# Patient Record
Sex: Female | Born: 1990 | Hispanic: Yes | Marital: Single | Smoking: Never smoker
Health system: Southern US, Community
[De-identification: ages and names within clinical notes are randomized; demographics above are authoritative.]

## PROBLEM LIST (undated history)

## (undated) DIAGNOSIS — R682 Dry mouth, unspecified: Secondary | ICD-10-CM

## (undated) DIAGNOSIS — F410 Panic disorder [episodic paroxysmal anxiety] without agoraphobia: Secondary | ICD-10-CM

## (undated) DIAGNOSIS — R790 Abnormal level of blood mineral: Secondary | ICD-10-CM

## (undated) HISTORY — DX: Abnormal level of blood mineral: R79.0

## (undated) HISTORY — DX: Dry mouth, unspecified: R68.2

## (undated) HISTORY — DX: Panic disorder (episodic paroxysmal anxiety): F41.0

---

## 2014-05-31 ENCOUNTER — Other Ambulatory Visit: Payer: Self-pay | Admitting: Adult Health

## 2014-05-31 ENCOUNTER — Ambulatory Visit (INDEPENDENT_AMBULATORY_CARE_PROVIDER_SITE_OTHER): Payer: Self-pay

## 2014-05-31 DIAGNOSIS — W19XXXA Unspecified fall, initial encounter: Secondary | ICD-10-CM

## 2014-05-31 DIAGNOSIS — M25552 Pain in left hip: Secondary | ICD-10-CM

## 2014-05-31 DIAGNOSIS — M542 Cervicalgia: Secondary | ICD-10-CM

## 2015-11-21 IMAGING — CR DG CERVICAL SPINE 2 OR 3 VIEWS
3 series · 3 of 3 positions shown · non-contrast
Comparison: None.

CLINICAL DATA: Fell hitting head, neck pain

EXAM:
CERVICAL SPINE - 2-3 VIEW

[view not recorded (1 of 3)]
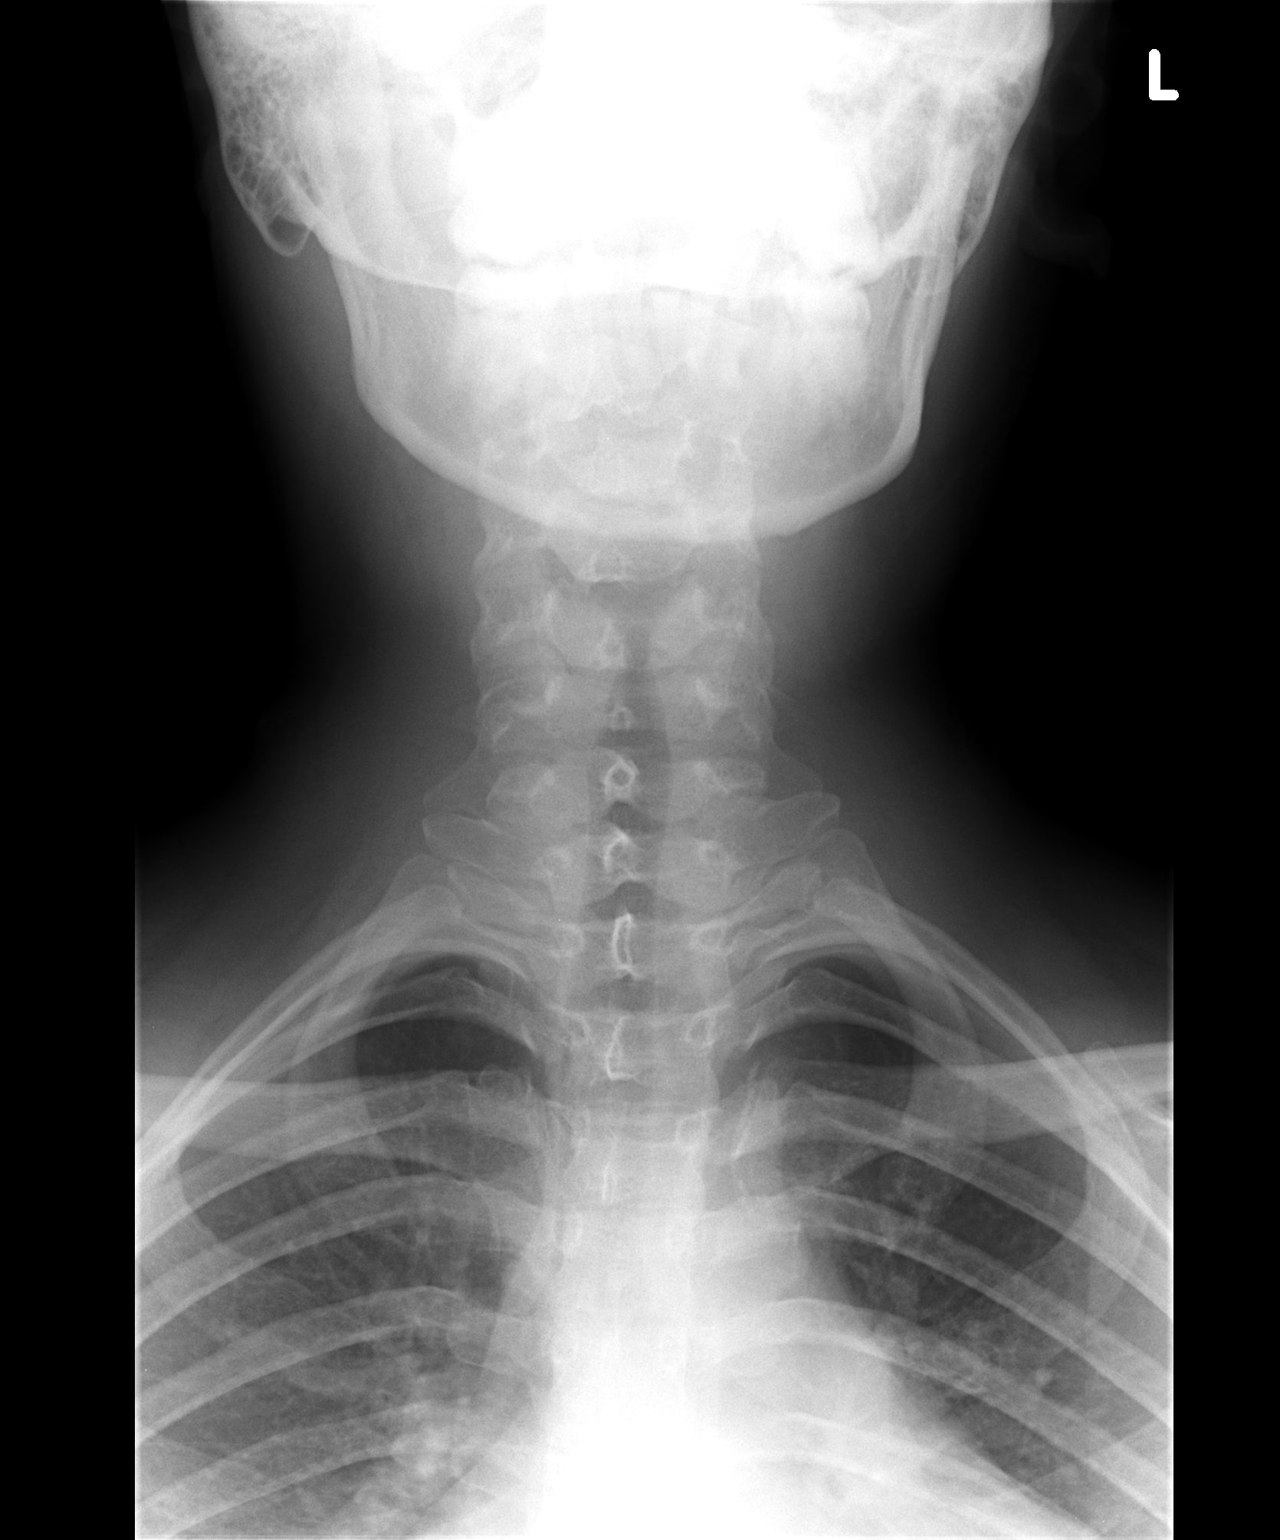

[view not recorded (2 of 3)]
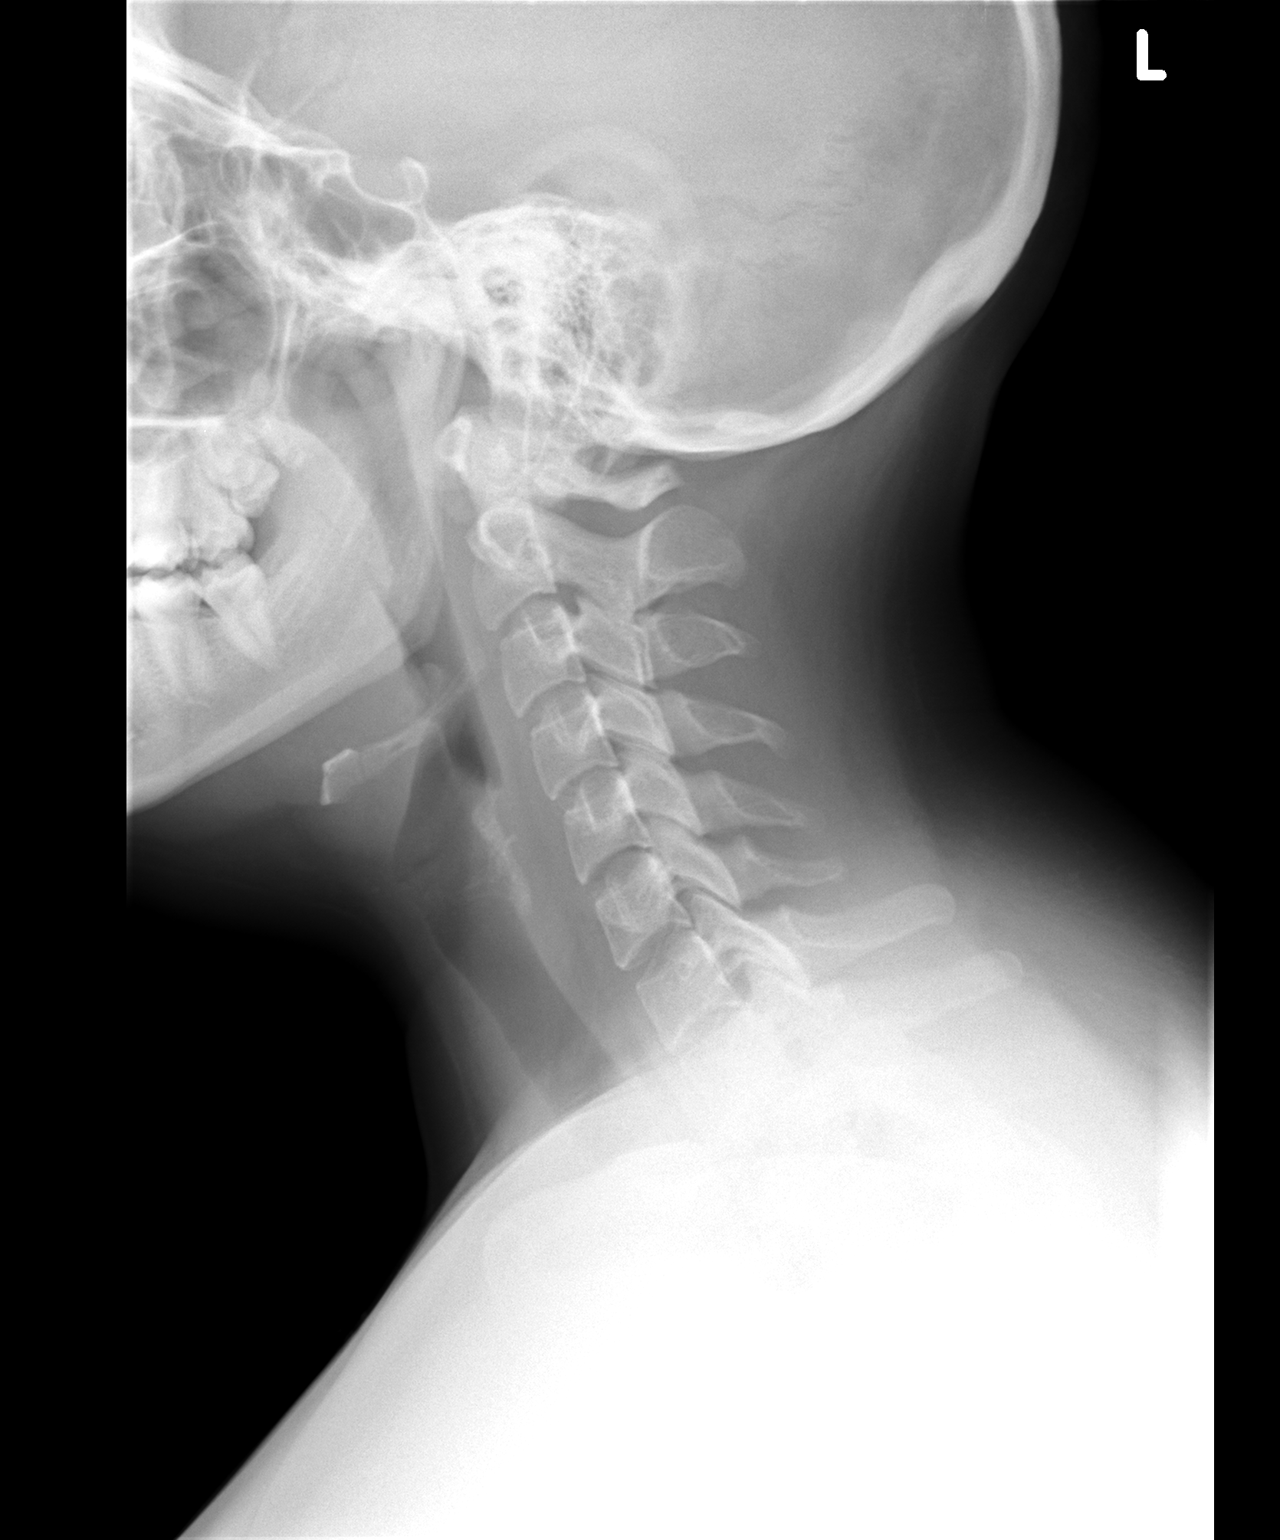

[view not recorded (3 of 3)]
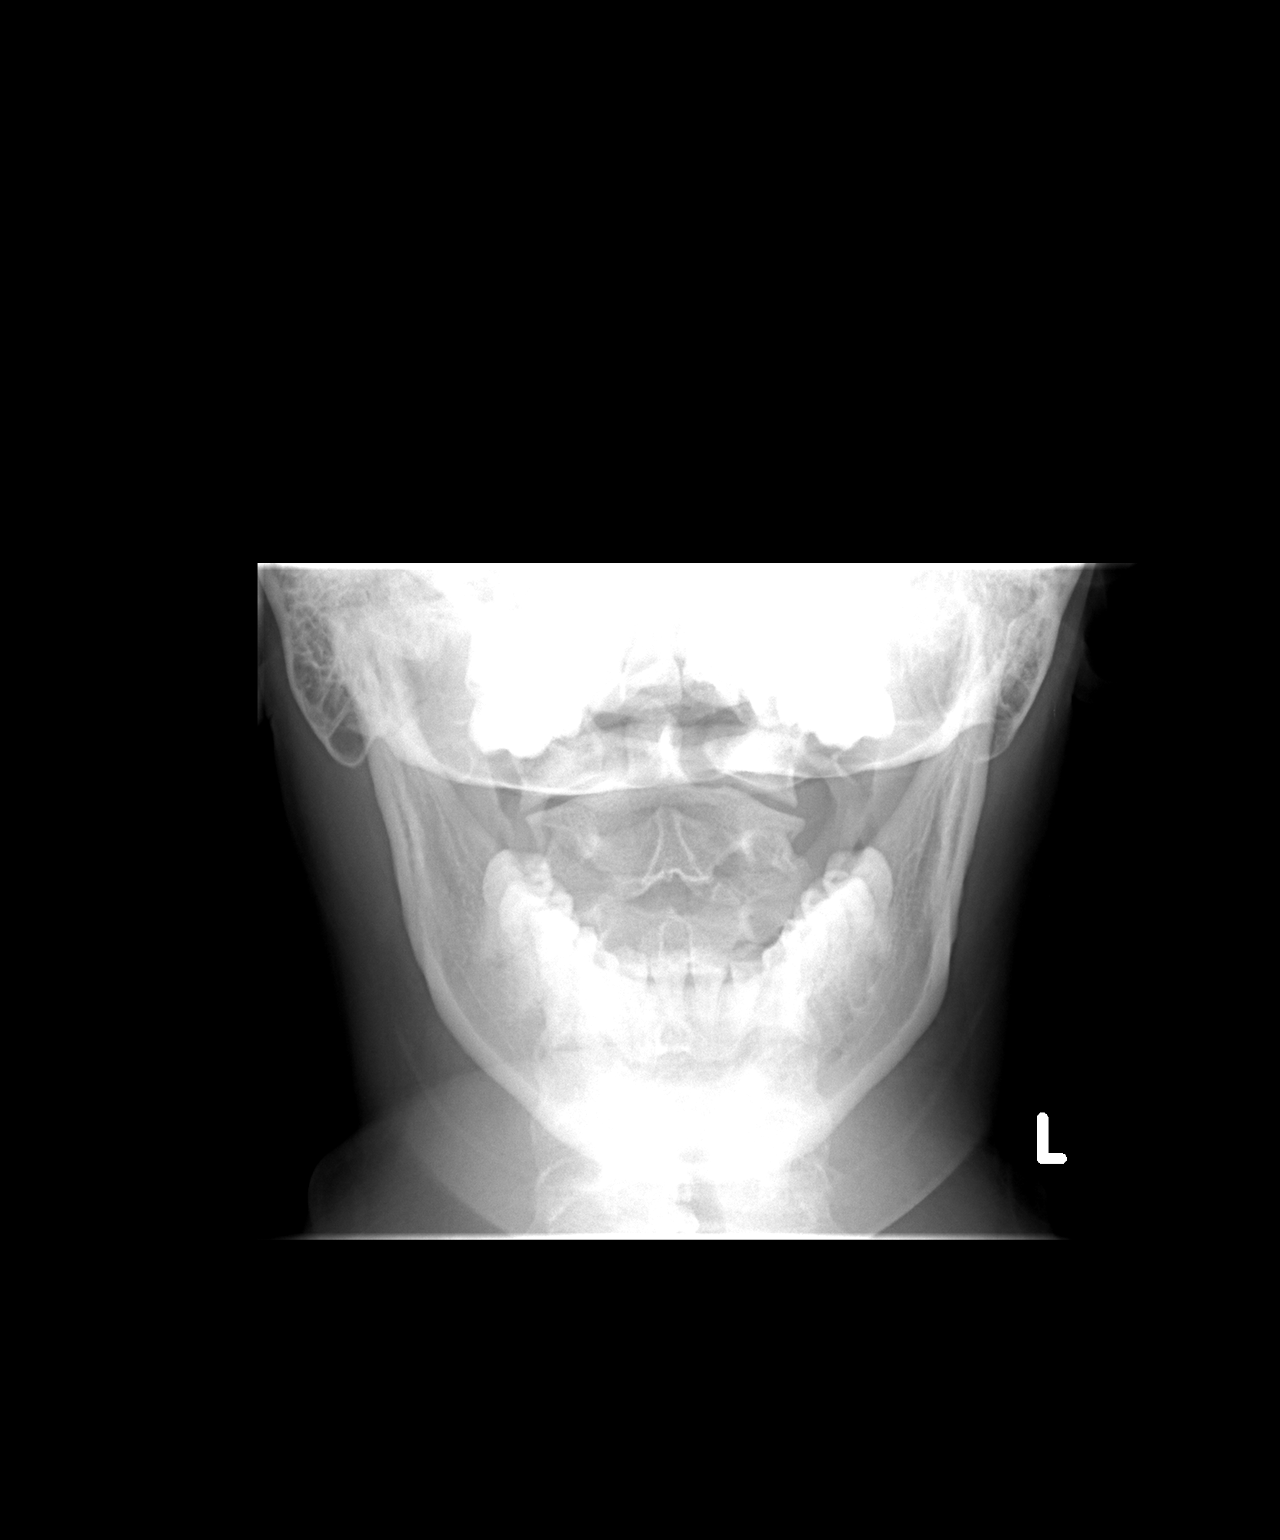

[3 of 3 positions shown; findings below may reference images not displayed]

FINDINGS: The cervical vertebrae are in normal alignment. Intervertebral disc
spaces appear normal. No prevertebral soft tissue swelling is
seen.The odontoid process is intact. The lung apices are clear.
IMPRESSION: Normal alignment.  Normal intervertebral disc spaces.

## 2016-10-12 ENCOUNTER — Encounter: Payer: Self-pay | Admitting: Physician Assistant

## 2016-10-12 ENCOUNTER — Ambulatory Visit (INDEPENDENT_AMBULATORY_CARE_PROVIDER_SITE_OTHER): Payer: Managed Care, Other (non HMO) | Admitting: Physician Assistant

## 2016-10-12 VITALS — BP 115/77 | HR 73 | Ht 60.0 in | Wt 164.0 lb

## 2016-10-12 DIAGNOSIS — N898 Other specified noninflammatory disorders of vagina: Secondary | ICD-10-CM | POA: Diagnosis not present

## 2016-10-12 DIAGNOSIS — F410 Panic disorder [episodic paroxysmal anxiety] without agoraphobia: Secondary | ICD-10-CM

## 2016-10-12 DIAGNOSIS — Z0000 Encounter for general adult medical examination without abnormal findings: Secondary | ICD-10-CM

## 2016-10-12 DIAGNOSIS — Z131 Encounter for screening for diabetes mellitus: Secondary | ICD-10-CM | POA: Diagnosis not present

## 2016-10-12 DIAGNOSIS — Z13220 Encounter for screening for lipoid disorders: Secondary | ICD-10-CM | POA: Diagnosis not present

## 2016-10-12 HISTORY — DX: Panic disorder (episodic paroxysmal anxiety): F41.0

## 2016-10-12 LAB — COMPLETE METABOLIC PANEL WITH GFR
ALBUMIN: 4.2 g/dL (ref 3.6–5.1)
ALK PHOS: 68 U/L (ref 33–115)
ALT: 13 U/L (ref 6–29)
AST: 16 U/L (ref 10–30)
BILIRUBIN TOTAL: 0.5 mg/dL (ref 0.2–1.2)
BUN: 7 mg/dL (ref 7–25)
CHLORIDE: 107 mmol/L (ref 98–110)
CO2: 23 mmol/L (ref 20–31)
CREATININE: 0.53 mg/dL (ref 0.50–1.10)
Calcium: 9 mg/dL (ref 8.6–10.2)
GFR, Est Non African American: >89 mL/min (ref >=60)
Glucose, Bld: 90 mg/dL (ref 65–99)
Potassium: 4.1 mmol/L (ref 3.5–5.3)
Sodium: 139 mmol/L (ref 135–146)
Total Protein: 6.7 g/dL (ref 6.1–8.1)

## 2016-10-12 LAB — CBC WITH DIFFERENTIAL/PLATELET
Basophils Absolute: 60 cells/uL (ref 0–200)
Basophils Relative: 1 %
Eosinophils Absolute: 120 cells/uL (ref 15–500)
Eosinophils Relative: 2 %
HCT: 38.9 % (ref 35.0–45.0)
Hemoglobin: 12.6 g/dL (ref 11.7–15.5)
LYMPHS PCT: 34 %
Lymphs Abs: 2040 cells/uL (ref 850–3900)
MCH: 28.2 pg (ref 27.0–33.0)
MCHC: 32.4 g/dL (ref 32.0–36.0)
MCV: 87 fL (ref 80.0–100.0)
MONO ABS: 360 {cells}/uL (ref 200–950)
MONOS PCT: 6 %
MPV: 9.1 fL (ref 7.5–12.5)
Neutro Abs: 3420 cells/uL (ref 1500–7800)
Neutrophils Relative %: 57 %
Platelets: 342 10*3/uL (ref 140–400)
RBC: 4.47 MIL/uL (ref 3.80–5.10)
RDW: 13.9 % (ref 11.0–15.0)
WBC: 6 10*3/uL (ref 3.8–10.8)

## 2016-10-12 LAB — WET PREP FOR TRICH, YEAST, CLUE
Trich, Wet Prep: NONE SEEN
Yeast Wet Prep HPF POC: NONE SEEN

## 2016-10-12 LAB — LIPID PANEL
Cholesterol: 147 mg/dL (ref <200)
HDL: 40 mg/dL — AB (ref >50)
LDL CALC: 89 mg/dL (ref <100)
Total CHOL/HDL Ratio: 3.7 Ratio (ref <5.0)
Triglycerides: 92 mg/dL (ref <150)
VLDL: 18 mg/dL (ref <30)

## 2016-10-12 LAB — TSH: TSH: 0.98 mIU/L

## 2016-10-12 LAB — FERRITIN: FERRITIN: 26 ng/mL (ref 10–154)

## 2016-10-12 MED ORDER — CLONAZEPAM 0.5 MG PO TABS: ORAL_TABLET | ORAL | 0 refills | Status: DC

## 2016-10-12 MED ORDER — METRONIDAZOLE 500 MG PO TABS: 500.0000 mg | ORAL_TABLET | Freq: Two times a day (BID) | ORAL | 0 refills | Status: DC

## 2016-10-12 NOTE — Addendum Note (Signed)
Addended by: Jomarie LongsBREEBACK, Ilyse Tremain L on: 10/12/2016 12:15 PM   Modules accepted: Orders

## 2016-10-12 NOTE — Progress Notes (Addendum)
Subjective:     Patient ID: Evelyn KollerYesenia Ocampo-Jimenez, female   DOB: [birth year], 26 y.o.   MRN: [id number]  HPI   Patient presents today to establish care and for annual work physical.   Also reports that a couple of months ago she was diagnosed with bacterial vaginosis but never received any medications. She states she has had discharge on and off since the diagnosis and occasionally notices an unusual odor so she would like to be rechecked.  Also complains of episodes where she feels like she cannot breathe. States she went to the emergency department 2 years ago for passing out and after she couldn't breathe. At this time she was given a holter monitor for one month which revealed no abnormal heart rhythms. Does state while on the holter monitor she did not experience an attack, however. Reports these episodes generally occur with increased stress and happen once every 2 months. Denies shortness of breath or chest pain exacerbated by walking. No chest pain today.  Social History   Social History  . Marital status: Single    Spouse name: N/A  . Number of children: N/A  . Years of education: N/A   Occupational History  . Not on file.   Social History Main Topics  . Smoking status: Never Smoker  . Smokeless tobacco: Never Used  . Alcohol use No  . Drug use: No  . Sexual activity: Not Currently   Other Topics Concern  . Not on file   Social History Narrative  . No narrative on file    Review of Systems All other ROS negative except those noted in the HPI.     Objective:   Physical Exam  Constitutional: She is oriented to person, place, and time. She appears well-developed and well-nourished.  HENT:  Head: Normocephalic and atraumatic.  Right Ear: External ear normal.  Left Ear: External ear normal.  Mouth/Throat: No oropharyngeal exudate.  Eyes: Conjunctivae and EOM are normal. Pupils are equal, round, and reactive to light.  Neck: Normal range of motion. Neck supple. No  thyromegaly present.  Cardiovascular: Normal rate and regular rhythm.  Exam reveals no gallop and no friction rub.   No murmur heard. Pulmonary/Chest: Effort normal and breath sounds normal. She has no wheezes. She has no rales.  Abdominal: Soft. Bowel sounds are normal. She exhibits no distension. There is no tenderness. There is no rebound and no guarding.  Musculoskeletal: Normal range of motion. She exhibits no edema or tenderness.  Lymphadenopathy:    She has no cervical adenopathy.  Neurological: She is alert and oriented to person, place, and time.  Skin: Skin is warm and dry.  Psychiatric: She has a normal mood and affect. Her behavior is normal.       Assessment/Plan:  Jenne CampusYesenia was seen today for establish care and annual exam.  Diagnoses and all orders for this visit:  Vaginal discharge -     WET PREP FOR TRICH, YEAST, CLUE  Routine physical examination -     TSH -     COMPLETE METABOLIC PANEL WITH GFR -     Lipid panel -     CBC with Differential/Platelet -     Ferritin  Screening for lipid disorders -     Lipid panel  Screening for diabetes mellitus -     COMPLETE METABOLIC PANEL WITH GFR  Panic disorder -     clonazePAM (KLONOPIN) 0.5 MG tablet; Take one tablet as needed for panic attacks  - Screening  labs ordered today. Will call patient with results.  - Rechecked wet prep to ensure resolution of bacterial vaginosis per patient request. - Difficulty breathing, chest tightness, and diaphoresis consistent with panic disorder. Blood pressure well controlled and last lipid panel normal. Previously has had cardiac work up for this problem further supporting the likelihood that this is related to a panic disorder. Instructed patient to take Klonopin only as needed for panic symptoms. States if this works and symptoms resolve with medication, panic disorder is confirmed. If symptoms are unaffected by Klonopin, however, instructed to come back in and will initiate further  work up. Come back in 2 months for recheck of symptom improvement.discussed side effects and abuse potential of klonapin.

## 2016-10-13 ENCOUNTER — Encounter: Payer: Self-pay | Admitting: Physician Assistant

## 2016-10-13 DIAGNOSIS — R790 Abnormal level of blood mineral: Secondary | ICD-10-CM | POA: Insufficient documentation

## 2016-10-13 HISTORY — DX: Abnormal level of blood mineral: R79.0

## 2016-10-13 NOTE — Progress Notes (Signed)
Call pt: iron stores on the low side. Consider ferrous sulfate 325mg  once a day. Not anemic. Thyroid normal range.  Cholesterol great.

## 2016-11-10 ENCOUNTER — Telehealth: Payer: Self-pay

## 2016-11-10 NOTE — Telephone Encounter (Signed)
Yes. We need to do some repeat testing.

## 2016-11-10 NOTE — Telephone Encounter (Signed)
Pt stated that she took the medication as prescribed in March for BV.  She feels that it is back and worse.  She is complaining of painful urination also.  Does she need an office visit?  Please advise.

## 2016-11-11 NOTE — Telephone Encounter (Signed)
Left message on VM to make appointment for further testing.

## 2016-11-25 ENCOUNTER — Telehealth: Payer: Self-pay

## 2016-11-25 NOTE — Telephone Encounter (Signed)
Pt is asking if her biometric screening was faxed.  Please advise.

## 2016-11-26 NOTE — Telephone Encounter (Signed)
Please let her know that was faxed today. Please send to scan for EMR.

## 2016-11-26 NOTE — Telephone Encounter (Signed)
Faxed today

## 2016-12-11 ENCOUNTER — Encounter: Payer: Self-pay | Admitting: Physician Assistant

## 2016-12-11 ENCOUNTER — Ambulatory Visit (INDEPENDENT_AMBULATORY_CARE_PROVIDER_SITE_OTHER): Payer: Managed Care, Other (non HMO) | Admitting: Physician Assistant

## 2016-12-11 ENCOUNTER — Ambulatory Visit: Payer: Managed Care, Other (non HMO) | Admitting: Physician Assistant

## 2016-12-11 VITALS — BP 120/75 | HR 81 | Ht 60.0 in | Wt 164.0 lb

## 2016-12-11 DIAGNOSIS — N760 Acute vaginitis: Secondary | ICD-10-CM

## 2016-12-11 LAB — WET PREP FOR TRICH, YEAST, CLUE
Clue Cells Wet Prep HPF POC: NONE SEEN
Trich, Wet Prep: NONE SEEN
YEAST WET PREP: NONE SEEN

## 2016-12-11 MED ORDER — HYDROCORTISONE VALERATE 0.2 % EX OINT: 1.0000 "application " | TOPICAL_OINTMENT | Freq: Two times a day (BID) | CUTANEOUS | 0 refills | Status: DC

## 2016-12-11 NOTE — Progress Notes (Signed)
   Subjective:    Patient ID: Evelyn Mullen, female    DOB: 01-31-1991, 26 y.o.   MRN: [id number]  HPI  Pt is a 26 yo female who presents to the clinic with external irritation, burning and at times itching for last 2-3 weeks. She was seen not long ago and dx of bacterial vaginosis and treated with metronidazole. Discharge resolved but labia itching/burning continued. No abdominal pain, flank pain, dyspareunia. She has not been sexually active in 3 months. She admits using some new toilet paper and causing some irritation. She stopped using toilet paper. She is unaware of anything that comes into contact with her genitals that is unsual.     Review of Systems  All other systems reviewed and are negative.      Objective:   Physical Exam  Constitutional: She is oriented to person, place, and time. She appears well-developed and well-nourished.  HENT:  Head: Normocephalic and atraumatic.  Cardiovascular: Normal rate, regular rhythm and normal heart sounds.   Pulmonary/Chest: Effort normal and breath sounds normal.  Abdominal: Soft. Bowel sounds are normal. She exhibits no distension and no mass. There is no tenderness. There is no rebound and no guarding.  Genitourinary: No vaginal discharge found.  Genitourinary Comments: Scant whitish film over external labia on an erythematous base.   Neurological: She is alert and oriented to person, place, and time.  Psychiatric: She has a normal mood and affect. Her behavior is normal.          Assessment & Plan:  Marland Kitchen.Marland Kitchen.Jenne CampusYesenia was seen today for vaginal irritation.  Diagnoses and all orders for this visit:  Acute vaginitis -     GC/Chlamydia Probe Amp -     WET PREP FOR TRICH, YEAST, CLUE  Other orders -     hydrocortisone valerate ointment (WESTCORT) 0.2 %; Apply 1 application topically 2 (two) times daily.   .. Results for orders placed or performed in visit on 12/11/16  WET PREP FOR TRICH, YEAST, CLUE  Result Value Ref Range    Yeast Wet Prep HPF POC NONE SEEN NONE SEEN   Trich, Wet Prep NONE SEEN NONE SEEN   Clue Cells Wet Prep HPF POC NONE SEEN NONE SEEN   WBC, Wet Prep HPF POC MANY (A) NONE SEEN  no yeast. Ok to send in topical steroid cream. Appears to be contact dermatitis. Watch out for trigger.  GC/CT testing done today.  Follow up as needed.

## 2016-12-11 NOTE — Progress Notes (Signed)
Call pt: no yeast, trich, clue cells. I am sending low dose steroid cream for external irritation. Watch things that are coming in contact with genitals I feel like you are and an allergic response to something you are coming in contact with.

## 2016-12-11 NOTE — Patient Instructions (Signed)
Will call with stat wet prep.

## 2016-12-12 LAB — GC/CHLAMYDIA PROBE AMP
CT Probe RNA: DETECTED — AB
GC Probe RNA: NOT DETECTED

## 2016-12-14 MED ORDER — AZITHROMYCIN 1 G PO PACK: 1.0000 g | PACK | Freq: Once | ORAL | 0 refills | Status: AC

## 2016-12-14 NOTE — Addendum Note (Signed)
Addended by: Jomarie LongsBREEBACK, Shakenna Herrero L on: 12/14/2016 08:09 AM   Modules accepted: Orders

## 2016-12-22 ENCOUNTER — Ambulatory Visit (INDEPENDENT_AMBULATORY_CARE_PROVIDER_SITE_OTHER): Payer: Managed Care, Other (non HMO) | Admitting: Physician Assistant

## 2016-12-22 ENCOUNTER — Encounter: Payer: Self-pay | Admitting: Physician Assistant

## 2016-12-22 ENCOUNTER — Other Ambulatory Visit (HOSPITAL_COMMUNITY)
Admission: RE | Admit: 2016-12-22 | Discharge: 2016-12-22 | Disposition: A | Discharge status: Home / Self Care | Payer: Managed Care, Other (non HMO) | Source: Ambulatory Visit | Attending: Physician Assistant | Admitting: Physician Assistant

## 2016-12-22 VITALS — BP 128/85 | HR 93 | Ht 60.0 in | Wt 162.0 lb

## 2016-12-22 DIAGNOSIS — Z8619 Personal history of other infectious and parasitic diseases: Secondary | ICD-10-CM | POA: Diagnosis present

## 2016-12-22 DIAGNOSIS — Z113 Encounter for screening for infections with a predominantly sexual mode of transmission: Secondary | ICD-10-CM | POA: Diagnosis not present

## 2016-12-22 DIAGNOSIS — Z23 Encounter for immunization: Secondary | ICD-10-CM

## 2016-12-22 DIAGNOSIS — Z01419 Encounter for gynecological examination (general) (routine) without abnormal findings: Secondary | ICD-10-CM | POA: Diagnosis not present

## 2016-12-22 DIAGNOSIS — B373 Candidiasis of vulva and vagina: Secondary | ICD-10-CM | POA: Insufficient documentation

## 2016-12-22 NOTE — Addendum Note (Signed)
Addended by: Donne AnonBENDER, Dilyn Osoria L on: 12/22/2016 01:18 PM   Modules accepted: Orders

## 2016-12-22 NOTE — Progress Notes (Signed)
   Subjective:    Patient ID: Evelyn KollerYesenia Mullen, female    DOB: 07-Feb-1991, 26 y.o.   MRN: [id number]  HPI  Pt is a 26 yo female who presents to the clinic for follow up on positive chlamydia test on May 4th, 2018. She took 2g of azithromycin. She is not currently sexually active. She wants to be re-tested today to make sure cleared and tested for all other STD's. She is due for a pap smear. Her abdominal cramping has improved. Her discharge has improved. No vaginal odor. She has no symptoms today.   .. Active Ambulatory Problems    Diagnosis Date Noted  . Panic disorder 10/12/2016  . Low iron stores 10/13/2016   Resolved Ambulatory Problems    Diagnosis Date Noted  . No Resolved Ambulatory Problems   No Additional Past Medical History   . Family History  Problem Relation Age of Onset  . Hyperlipidemia Maternal Aunt   . Hypertension Maternal Aunt   . Diabetes Paternal Grandmother       Review of Systems  All other systems reviewed and are negative.      Objective:   Physical Exam  Constitutional: She is oriented to person, place, and time. She appears well-developed and well-nourished.  HENT:  Head: Normocephalic and atraumatic.  Cardiovascular: Normal rate, regular rhythm and normal heart sounds.   Pulmonary/Chest: Effort normal and breath sounds normal.  Abdominal: Soft. Bowel sounds are normal. She exhibits no distension and no mass. There is no tenderness. There is no rebound and no guarding.  Genitourinary: Vaginal discharge found.  Genitourinary Comments: External genitals no abnormality.  Cervical os viewed with no polyps.  Scant grey discharge around cervix.  No cervical adnexal tenderness to palpation.   Neurological: She is alert and oriented to person, place, and time.  Psychiatric: She has a normal mood and affect. Her behavior is normal.          Assessment & Plan:  Marland Kitchen.Marland Kitchen.Diagnoses and all orders for this visit:  Encounter for well woman exam  with routine gynecological exam -     Cancel: Cytology - PAP -     Cytology - PAP  History of chlamydia -     Cancel: GC/Chlamydia Probe Amp -     Cytology - PAP -     HIV antibody (with reflex) -     RPR  Screening examination for STD (sexually transmitted disease) -     HIV antibody (with reflex) -     RPR   Pap done today with STD testing. Only been 11 days since treatment but I feel test should still be negative if treatment worked despite guidelines of testing in 4 weeks for cure.  Encouraged condoms to protect against STD.   Discussed vitamin D 800 units daily with 4 servings of dairy.  encouraged regular exercise.

## 2016-12-23 LAB — HIV ANTIBODY (ROUTINE TESTING W REFLEX): HIV: NONREACTIVE

## 2016-12-23 LAB — RPR

## 2016-12-23 NOTE — Progress Notes (Signed)
Call pt: RPR negative.  HIV negative.   Will call other results come in.

## 2016-12-25 LAB — CYTOLOGY - PAP
BACTERIAL VAGINITIS: NEGATIVE
CANDIDA VAGINITIS: POSITIVE — AB
Chlamydia: NEGATIVE
Diagnosis: NEGATIVE
NEISSERIA GONORRHEA: NEGATIVE
TRICH (WINDOWPATH): NEGATIVE

## 2016-12-29 ENCOUNTER — Other Ambulatory Visit: Payer: Self-pay

## 2016-12-29 MED ORDER — FLUCONAZOLE 150 MG PO TABS: 150.0000 mg | ORAL_TABLET | Freq: Once | ORAL | 0 refills | Status: DC

## 2016-12-31 ENCOUNTER — Ambulatory Visit (INDEPENDENT_AMBULATORY_CARE_PROVIDER_SITE_OTHER): Payer: Managed Care, Other (non HMO) | Admitting: Family Medicine

## 2016-12-31 ENCOUNTER — Encounter: Payer: Self-pay | Admitting: Family Medicine

## 2016-12-31 VITALS — BP 116/69 | HR 74 | Temp 98.2°F | Wt 163.0 lb

## 2016-12-31 DIAGNOSIS — R682 Dry mouth, unspecified: Secondary | ICD-10-CM

## 2016-12-31 DIAGNOSIS — B373 Candidiasis of vulva and vagina: Secondary | ICD-10-CM | POA: Diagnosis not present

## 2016-12-31 DIAGNOSIS — B3731 Acute candidiasis of vulva and vagina: Secondary | ICD-10-CM

## 2016-12-31 HISTORY — DX: Dry mouth, unspecified: R68.2

## 2016-12-31 MED ORDER — FLUCONAZOLE 150 MG PO TABS: 150.0000 mg | ORAL_TABLET | Freq: Once | ORAL | 1 refills | Status: AC

## 2016-12-31 NOTE — Patient Instructions (Addendum)
Thank you for coming in today. I think this is dry mouth.  Try using sugar free sour candy.  You can also use over the counter mouth wash for dry mouth.  Avoid Vapeing.  Recheck if not better.   For yeast infection take fluconazole again if not better after 1 week from the last dose.

## 2016-12-31 NOTE — Progress Notes (Signed)
       Evelyn Mullen is a 26 y.o. female [name] today for dry mouth. Patient notes a 3 day history of decreased saliva in her mouth with this alive and healthy weight. She is not currently taking any medications. She most recently took fluconazole 2 days ago for a yeast infection. She denies any fevers or chills tongue or lip swelling or trouble swallowing or breathing. She denies any rash. She does not currently have any sneezing or runny nose or itchy watery eyes.  She does note that she still having some yeast infection symptoms such as vaginal itching.   No past medical history on file. Past Surgical History:  Procedure Laterality Date  . CESAREAN SECTION     Social History  Substance Use Topics  . Smoking status: Never Smoker  . Smokeless tobacco: Never Used  . Alcohol use No   family history includes Diabetes in her paternal grandmother; Hyperlipidemia in her maternal aunt; Hypertension in her maternal aunt.  ROS as above:  Medications: Current Outpatient Prescriptions  Medication Sig Dispense Refill  . clonazePAM (KLONOPIN) 0.5 MG tablet Take one tablet as needed for panic attacks 30 tablet 0  . fluconazole (DIFLUCAN) 150 MG tablet Take 1 tablet (150 mg total) by mouth once. 1 tablet 1  . hydrocortisone valerate ointment (WESTCORT) 0.2 % Apply 1 application topically 2 (two) times daily. 45 g 0   No current facility-administered medications for this visit.    No Known Allergies  Health Maintenance Health Maintenance  Topic Date Due  . INFLUENZA VACCINE  10/12/2017 (Originally 03/10/2017)  . PAP SMEAR  12/23/2019  . TETANUS/TDAP  12/23/2026  . HIV Screening  Completed     Exam:  BP 116/69   Pulse 74   Temp 98.2 F (36.8 C) (Oral)   Wt 163 lb (73.9 kg)   SpO2 100%   BMI 31.83 kg/m  Gen: Well NAD HEENT: EOMI,  MMM  Tongue with slight whitish scrappable saliva. Otherwise oropharynx is normal-appearing no cervical lymphadenopathy. Salivary glands are nontender. No tongue or lip swelling Lungs: Normal work of breathing. CTABL Heart: RRR no MRG Abd: NABS, Soft. Nondistended, Nontender Exts: Brisk capillary refill, warm and well perfused.    No results found for this or any previous visit (from the past 72 hour(s)). No results found.    Assessment and Plan: 26 y.o. female with dry mouth. Unclear etiology. Plan for watchful waiting with symptomatic management. If not better will proceed with further workup.  Yeast infection will try fluconazole  in 5 days again if not better   No orders of the defined types were placed in this encounter.  Meds ordered this encounter  Medications  . fluconazole (DIFLUCAN) 150 MG tablet    Sig: Take 1 tablet (150 mg total) by mouth once.    Dispense:  1 tablet    Refill:  1     Discussed warning signs or symptoms. Please see discharge instructions. Patient expresses understanding.

## 2017-01-26 ENCOUNTER — Encounter: Payer: Self-pay | Admitting: Physician Assistant

## 2017-01-26 ENCOUNTER — Ambulatory Visit (INDEPENDENT_AMBULATORY_CARE_PROVIDER_SITE_OTHER): Payer: Managed Care, Other (non HMO) | Admitting: Physician Assistant

## 2017-01-26 VITALS — BP 115/80 | HR 112 | Temp 98.4°F | Ht 60.0 in | Wt 162.0 lb

## 2017-01-26 DIAGNOSIS — M791 Myalgia, unspecified site: Secondary | ICD-10-CM

## 2017-01-26 DIAGNOSIS — R197 Diarrhea, unspecified: Secondary | ICD-10-CM

## 2017-01-26 DIAGNOSIS — R509 Fever, unspecified: Secondary | ICD-10-CM | POA: Diagnosis not present

## 2017-01-26 NOTE — Patient Instructions (Addendum)
Viral Gastroenteritis, Adult Viral gastroenteritis is also known as the stomach flu. This condition is caused by certain germs (viruses). These germs can be passed from person to person very easily (are very contagious). This condition can cause sudden watery poop (diarrhea), fever, and throwing up (vomiting). Having watery poop and throwing up can make you feel weak and cause you to get dehydrated. Dehydration can make you tired and thirsty, make you have a dry mouth, and make it so you pee (urinate) less often. Older adults and people with other diseases or a weak defense system (immune system) are at higher risk for dehydration. It is important to replace the fluids that you lose from having watery poop and throwing up. Follow these instructions at home: Follow instructions from your doctor about how to care for yourself at home. Eating and drinking  Follow these instructions as told by your doctor:  Take an oral rehydration solution (ORS). This is a drink that is sold at pharmacies and stores.  Drink clear fluids in small amounts as you are able, such as: ? Water. ? Ice chips. ? Diluted fruit juice. ? Low-calorie sports drinks.  Eat bland, easy-to-digest foods in small amounts as you are able, such as: ? Bananas. ? Applesauce. ? Rice. ? Low-fat (lean) meats. ? Toast. ? Crackers.  Avoid fluids that have a lot of sugar or caffeine in them.  Avoid alcohol.  Avoid spicy or fatty foods.  General instructions  Drink enough fluid to keep your pee (urine) clear or pale yellow.  Wash your hands often. If you cannot use soap and water, use hand sanitizer.  Make sure that all people in your home wash their hands well and often.  Rest at home while you get better.  Take over-the-counter and prescription medicines only as told by your doctor.  Watch your condition for any changes.  Take a warm bath to help with any burning or pain from having watery poop.  Keep all follow-up  visits as told by your doctor. This is important. Contact a doctor if:  You cannot keep fluids down.  Your symptoms get worse.  You have new symptoms.  You feel light-headed or dizzy.  You have muscle cramps. Get help right away if:  You have chest pain.  You feel very weak or you pass out (faint).  You see blood in your throw-up.  Your throw-up looks like coffee grounds.  You have bloody or black poop (stools) or poop that look like tar.  You have a very bad headache, a stiff neck, or both.  You have a rash.  You have very bad pain, cramping, or bloating in your belly (abdomen).  You have trouble breathing.  You are breathing very quickly.  Your heart is beating very quickly.  Your skin feels cold and clammy.  You feel confused.  You have pain when you pee.  You have signs of dehydration, such as: ? Dark pee, hardly any pee, or no pee. ? Cracked lips. ? Dry mouth. ? Sunken eyes. ? Sleepiness. ? Weakness. This information is not intended to replace advice given to you by your health care provider. Make sure you discuss any questions you have with your health care provider. Document Released: 01/13/2008 Document Revised: 02/14/2016 Document Reviewed: 04/02/2015 Elsevier Interactive Patient Education  2017 [employer] Food Choices to Help Relieve Diarrhea, Adult When you have diarrhea, the foods you eat and your eating habits are very important. Choosing the right foods and drinks can   help:  Relieve diarrhea.  Replace lost fluids and nutrients.  Prevent dehydration.  What general guidelines should I follow? Relieving diarrhea  Choose foods with less than 2 g or .07 oz. of fiber per serving.  Limit fats to less than 8 tsp (38 g or 1.34 oz.) a day.  Avoid the following: ? Foods and beverages sweetened with high-fructose corn syrup, honey, or sugar alcohols such as xylitol, sorbitol, and mannitol. ? Foods that contain a lot of fat or  sugar. ? Fried, greasy, or spicy foods. ? High-fiber grains, breads, and cereals. ? Raw fruits and vegetables.  Eat foods that are rich in probiotics. These foods include dairy products such as yogurt and fermented milk products. They help increase healthy bacteria in the stomach and intestines (gastrointestinal tract, or GI tract).  If you have lactose intolerance, avoid dairy products. These may make your diarrhea worse.  Take medicine to help stop diarrhea (antidiarrheal medicine) only as told by your health care provider. Replacing nutrients  Eat small meals or snacks every 3-4 hours.  Eat bland foods, such as white rice, toast, or baked potato, until your diarrhea starts to get better. Gradually reintroduce nutrient-rich foods as tolerated or as told by your health care provider. This includes: ? Well-cooked protein foods. ? Peeled, seeded, and soft-cooked fruits and vegetables. ? Low-fat dairy products.  Take vitamin and mineral supplements as told by your health care provider. Preventing dehydration   Start by sipping water or a special solution to prevent dehydration (oral rehydration solution, ORS). Urine that is clear or pale yellow means that you are getting enough fluid.  Try to drink at least 8-10 cups of fluid each day to help replace lost fluids.  You may add other liquids in addition to water, such as clear juice or decaffeinated sports drinks, as tolerated or as told by your health care provider.  Avoid drinks with caffeine, such as coffee, tea, or soft drinks.  Avoid alcohol. What foods are recommended? The items listed may not be a complete list. Talk with your health care provider about what dietary choices are best for you. Grains White rice. White, French, or pita breads (fresh or toasted), including plain rolls, buns, or bagels. White pasta. Saltine, soda, or graham crackers. Pretzels. Low-fiber cereal. Cooked cereals made with water (such as cornmeal,  farina, or cream cereals). Plain muffins. Matzo. Melba toast. Zwieback. Vegetables Potatoes (without the skin). Most well-cooked and canned vegetables without skins or seeds. Tender lettuce. Fruits Apple sauce. Fruits canned in juice. Cooked apricots, cherries, grapefruit, peaches, pears, or plums. Fresh bananas and cantaloupe. Meats and other protein foods Baked or boiled chicken. Eggs. Tofu. Fish. Seafood. Smooth nut butters. Ground or well-cooked tender beef, ham, veal, lamb, pork, or poultry. Dairy Plain yogurt, kefir, and unsweetened liquid yogurt. Lactose-free milk, buttermilk, skim milk, or soy milk. Low-fat or nonfat hard cheese. Beverages Water. Low-calorie sports drinks. Fruit juices without pulp. Strained tomato and vegetable juices. Decaffeinated teas. Sugar-free beverages not sweetened with sugar alcohols. Oral rehydration solutions, if approved by your health care provider. Seasoning and other foods Bouillon, broth, or soups made from recommended foods. What foods are not recommended? The items listed may not be a complete list. Talk with your health care provider about what dietary choices are best for you. Grains Whole grain, whole wheat, bran, or rye breads, rolls, pastas, and crackers. Wild or brown rice. Whole grain or bran cereals. Barley. Oats and oatmeal. Corn tortillas or taco shells. Granola. Popcorn.   Vegetables Raw vegetables. Fried vegetables. Cabbage, broccoli, Brussels sprouts, artichokes, baked beans, beet greens, corn, kale, legumes, peas, sweet potatoes, and yams. Potato skins. Cooked spinach and cabbage. Fruits Dried fruit, including raisins and dates. Raw fruits. Stewed or dried prunes. Canned fruits with syrup. Meat and other protein foods Fried or fatty meats. Deli meats. Chunky nut butters. Nuts and seeds. Beans and lentils. Bacon. Hot dogs. Sausage. Dairy High-fat cheeses. Whole milk, chocolate milk, and beverages made with milk, such as milk shakes.  Half-and-half. Cream. sour cream. Ice cream. Beverages Caffeinated beverages (such as coffee, tea, soda, or energy drinks). Alcoholic beverages. Fruit juices with pulp. Prune juice. Soft drinks sweetened with high-fructose corn syrup or sugar alcohols. High-calorie sports drinks. Fats and oils Butter. Cream sauces. Margarine. Salad oils. Plain salad dressings. Olives. Avocados. Mayonnaise. Sweets and desserts Sweet rolls, doughnuts, and sweet breads. Sugar-free desserts sweetened with sugar alcohols such as xylitol and sorbitol. Seasoning and other foods Honey. Hot sauce. Chili powder. Gravy. Cream-based or milk-based soups. Pancakes and waffles. Summary  When you have diarrhea, the foods you eat and your eating habits are very important.  Make sure you get at least 8-10 cups of fluid each day, or enough to keep your urine clear or pale yellow.  Eat bland foods and gradually reintroduce healthy, nutrient-rich foods as tolerated, or as told by your health care provider.  Avoid high-fiber, fried, greasy, or spicy foods. This information is not intended to replace advice given to you by your health care provider. Make sure you discuss any questions you have with your health care provider. Document Released: 10/17/2003 Document Revised: 07/24/2016 Document Reviewed: 07/24/2016 Elsevier Interactive Patient Education  2017 [employer]  

## 2017-01-26 NOTE — Progress Notes (Signed)
   Subjective:    Patient ID: Evelyn Mullen, female    DOB: 1990/09/02, 26 y.o.   MRN: [id number]  HPI  Pt is a 26 yo female who presents to the clinic for follow up after hospital. Visit on 6/18 for sudden fever of 103 and body aches. After a complete work up with CBC, CMP, CXR, lumbar puncture, UA, CT of head everything looked good and determined it was likely viral. HR is hospital was elevated. HR today is 98. She did get fluids. After leaving the hospital she had some watery light yellowish diarrhea. She has not started any new medication. Her stomach is a bit crampy and little nauseated. No vomiting. Fever has improved with tylenol. She is feeling a little better.   .. Active Ambulatory Problems    Diagnosis Date Noted  . Panic disorder 10/12/2016  . Low iron stores 10/13/2016  . Dry mouth 12/31/2016   Resolved Ambulatory Problems    Diagnosis Date Noted  . No Resolved Ambulatory Problems   No Additional Past Medical History      Review of Systems See HPI.     Objective:   Physical Exam  Constitutional: She is oriented to person, place, and time. She appears well-developed and well-nourished.  HENT:  Head: Normocephalic and atraumatic.  Right Ear: External ear normal.  Left Ear: External ear normal.  Eyes: Conjunctivae are normal.  Neck: Normal range of motion. Neck supple.  Cardiovascular: Normal rate, regular rhythm and normal heart sounds.   Pulmonary/Chest:  Negative CVA tenderness.   Abdominal: Soft. Bowel sounds are normal.  Diffuse generalized tenderness.   Neurological: She is alert and oriented to person, place, and time.  Psychiatric: She has a normal mood and affect. Her behavior is normal.          Assessment & Plan:  Marland Kitchen.Marland Kitchen.Diagnoses and all orders for this visit:  Fever, unspecified fever cause -     Stool Culture -     Ova and parasite examination -     Clostridium difficile culture-fecal -     CBC with Differential/Platelet  Diarrhea,  unspecified type -     Stool Culture -     Ova and parasite examination -     Clostridium difficile culture-fecal -     CBC with Differential/Platelet  Myalgia   Since diarrhea did start I suspect patient has some viral gastroenteritis.  If diarrhea persist need to get stool cultures and repeat CBC.  Discussed BRAT diet and staying hydrated.  Continue to take tylenol for fever.  Follow up with any new or changing symptoms.

## 2017-01-27 ENCOUNTER — Encounter: Payer: Self-pay | Admitting: Physician Assistant

## 2017-01-28 ENCOUNTER — Telehealth: Payer: Self-pay

## 2017-01-28 LAB — CBC WITH DIFFERENTIAL/PLATELET
BASOS PCT: 1 %
Basophils Absolute: 40 cells/uL (ref 0–200)
EOS PCT: 1 %
Eosinophils Absolute: 40 cells/uL (ref 15–500)
HCT: 39.3 % (ref 35.0–45.0)
HEMOGLOBIN: 12.6 g/dL (ref 11.7–15.5)
LYMPHS ABS: 1440 {cells}/uL (ref 850–3900)
Lymphocytes Relative: 36 %
MCH: 27.8 pg (ref 27.0–33.0)
MCHC: 32.1 g/dL (ref 32.0–36.0)
MCV: 86.6 fL (ref 80.0–100.0)
MONOS PCT: 16 %
MPV: 9.2 fL (ref 7.5–12.5)
Monocytes Absolute: 640 cells/uL (ref 200–950)
NEUTROS ABS: 1840 {cells}/uL (ref 1500–7800)
Neutrophils Relative %: 46 %
PLATELETS: 344 10*3/uL (ref 140–400)
RBC: 4.54 MIL/uL (ref 3.80–5.10)
RDW: 14.2 % (ref 11.0–15.0)
WBC: 4 10*3/uL (ref 3.8–10.8)

## 2017-01-28 NOTE — Telephone Encounter (Signed)
Pt called and stated that she was seen for a viral infection. Pt wanted to know if she could return to work. Pt advised as long as she has been fever free for 24 hours she could return. Pt stated she is still having diarrhea with some cramping but it has gotten better. Pt stated that she does have a sore throat. Pt advised to schedule an appt if her symptoms get worse. Pt also advised to drink plenty of water to keep hydrated. Pt wants to know if she can take Ajin for her sore throat? Please advise?

## 2017-01-29 LAB — OVA AND PARASITE EXAMINATION: OP: NONE SEEN

## 2017-01-29 NOTE — Telephone Encounter (Signed)
Yes, that is fine. 

## 2017-01-29 NOTE — Telephone Encounter (Signed)
Pt no longer wants to take Ajin. Pt wants to know if she can take Robitussin for her cough and sore throat without it causing her diarrhea to worsen? Please advise?

## 2017-01-29 NOTE — Telephone Encounter (Signed)
I don't know what that medication is.  What are the active ingredients?

## 2017-01-29 NOTE — Telephone Encounter (Signed)
LVM requesting pt to call the office.  

## 2017-02-01 LAB — STOOL CULTURE

## 2017-02-03 LAB — CLOSTRIDIUM DIFFICILE CULTURE-FECAL

## 2017-02-03 NOTE — Telephone Encounter (Signed)
Left message advising of recommendations.  

## 2017-02-23 ENCOUNTER — Ambulatory Visit (INDEPENDENT_AMBULATORY_CARE_PROVIDER_SITE_OTHER): Payer: Managed Care, Other (non HMO) | Admitting: Physician Assistant

## 2017-02-23 ENCOUNTER — Encounter: Payer: Self-pay | Admitting: Physician Assistant

## 2017-02-23 VITALS — BP 120/75 | HR 106 | Wt 160.0 lb

## 2017-02-23 DIAGNOSIS — S30814A Abrasion of vagina and vulva, initial encounter: Secondary | ICD-10-CM | POA: Diagnosis not present

## 2017-02-23 DIAGNOSIS — Z113 Encounter for screening for infections with a predominantly sexual mode of transmission: Secondary | ICD-10-CM

## 2017-02-23 DIAGNOSIS — Z7251 High risk heterosexual behavior: Secondary | ICD-10-CM

## 2017-02-23 LAB — POCT URINE PREGNANCY: Preg Test, Ur: NEGATIVE

## 2017-02-23 MED ORDER — LIDOCAINE 5 % EX OINT: 1.0000 "application " | TOPICAL_OINTMENT | Freq: Every day | CUTANEOUS | 0 refills | Status: DC | PRN

## 2017-02-23 NOTE — Patient Instructions (Addendum)
-   Apply topical lidocaine twice daily as needed for vaginal pain - Abstain from sexual intercourse for the next week - Go downstairs to the lab to complete your screening

## 2017-02-23 NOTE — Progress Notes (Signed)
HPI:                                                                Evelyn Mullen is a 26 y.o. female who [name] today for vaginal pain  Patient reports vaginal trauma on Sunday after intercourse with her partner. States she noticed a "deep cut" inside her vagina. She endorses burning when she urinates. Denies uncontrolled vaginal bleeding. She is sexually active with 1 female partner; does not use condoms or any form of contraception. LMP approx 1 month ago. She is requesting STI screening. Denies abnormal vaginal discharge or pelvic pain.  Patient also states she noted a bulge around her anus. It is nontender, nonbleeding. Denies constipation, BRBPR, hematochezia.   No past medical history on file. Past Surgical History:  Procedure Laterality Date  . CESAREAN SECTION     Social History  Substance Use Topics  . Smoking status: Never Smoker  . Smokeless tobacco: Never Used  . Alcohol use No   family history includes Diabetes in her paternal grandmother; Hyperlipidemia in her maternal aunt; Hypertension in her maternal aunt.  ROS: negative except as noted in the HPI  Medications: No current outpatient prescriptions on file.   No current facility-administered medications for this visit.    No Known Allergies     Objective:  BP 120/75   Pulse (!) 106   Wt 160 lb (72.6 kg)   BMI 31.25 kg/m  Gen: well-groomed, cooperative, not ill-appearing, no distress HEENT: normal conjunctiva, trachea midline Pulm: Normal work of breathing, normal phonation GU: vulva without rashes or lesions, normal introitus and urethral meatus, vaginal mucosa with small 0.5cm abrasion at 9 o'clock, normal discharge, cervix non-friable without lesions; anus without rashes or lesions, no hemorrhoid or skin tag visible Neuro: alert and oriented x 3, EOM's intact, no tremor MSK: moving all extremities, normal gait and station, no  peripheral edema  No results found for this or any previous visit (from the past 72 hour(s)). No results found.  A chaperone was used for the GU portion of the exam, Dr. Sunnie NielsenNatalie Alexander  Assessment and Plan: 26 y.o. female with   1. Routine screening for STI (sexually transmitted infection) - SureSwab, T.vaginalis RNA,Ql,Female - GC/Chlamydia Probe Amp - Hepatitis C antibody - HIV antibody - RPR  2. Unprotected sexual intercourse - POCT urine pregnancy negative - patient is not trying to prevent pregnancy. Declines contraception  3. Abrasion of vagina, initial encounter - Apply topical lidocaine twice daily as needed for vaginal pain - Abstain from sexual intercourse for the next week - lidocaine (XYLOCAINE) 5 % ointment; Apply 1 application topically daily as needed.  Dispense: 50 g; Refill: 0   Patient education and anticipatory guidance given Patient agrees with treatment plan Follow-up as needed if symptoms worsen or fail to improve  Levonne Hubertharley E. Cummings PA-C

## 2017-02-24 LAB — SURESWAB, T.VAGINALIS RNA,QL,FEMALE: Trichomonas vaginalis RNA: NOT DETECTED

## 2017-02-24 LAB — GC/CHLAMYDIA PROBE AMP
CT Probe RNA: NOT DETECTED
GC Probe RNA: NOT DETECTED

## 2017-02-24 LAB — HIV ANTIBODY (ROUTINE TESTING W REFLEX): HIV: NONREACTIVE

## 2017-02-24 LAB — RPR

## 2017-02-24 LAB — HEPATITIS C ANTIBODY: HCV Ab: NEGATIVE

## 2017-02-24 NOTE — Progress Notes (Signed)
All STI testing is negative

## 2017-07-26 ENCOUNTER — Ambulatory Visit (INDEPENDENT_AMBULATORY_CARE_PROVIDER_SITE_OTHER): Payer: 59 | Admitting: Physician Assistant

## 2017-07-26 ENCOUNTER — Encounter: Payer: Self-pay | Admitting: Physician Assistant

## 2017-07-26 VITALS — BP 123/69 | HR 79 | Ht 60.0 in | Wt 155.0 lb

## 2017-07-26 DIAGNOSIS — N898 Other specified noninflammatory disorders of vagina: Secondary | ICD-10-CM

## 2017-07-26 DIAGNOSIS — Z113 Encounter for screening for infections with a predominantly sexual mode of transmission: Secondary | ICD-10-CM

## 2017-07-26 DIAGNOSIS — R233 Spontaneous ecchymoses: Secondary | ICD-10-CM

## 2017-07-26 DIAGNOSIS — Z7251 High risk heterosexual behavior: Secondary | ICD-10-CM | POA: Diagnosis not present

## 2017-07-26 LAB — POCT RAPID STREP A (OFFICE): Rapid Strep A Screen: NEGATIVE

## 2017-07-26 NOTE — Patient Instructions (Addendum)
Will get cbc to confirm normal platelets.  Will order wet prep and STD testing.

## 2017-07-26 NOTE — Progress Notes (Addendum)
   Subjective:    Patient ID: Evelyn Mullen, female    DOB: [birth year], 26 y.o.   MRN: [id number]  HPI Patient is a 26 y/o female who presents for STI testing. She denies symptoms of an STI however reports new sexual partner and did not use any protection, therefore wanted to be evaluated. She reports history of STI. Her partner was sick with a sore throat recently which she thinks he gave to her because her throat hurt for one day, however she denies current sore throat. She does see tiny red spots on back of throat that concerns her for strep throat. Patient denies vaginal discharge, vaginal pain, dysuria, ear pain, abdominal pain, lymphadenopathy, increased fatigue, fever, chills, body aches or easy bruising. She is having some vaginal irritation.   .. Active Ambulatory Problems    Diagnosis Date Noted  . Panic disorder 10/12/2016  . Low iron stores 10/13/2016  . Dry mouth 12/31/2016   Resolved Ambulatory Problems    Diagnosis Date Noted  . No Resolved Ambulatory Problems   Past Medical History:  Diagnosis Date  . Dry mouth 12/31/2016  . Low iron stores 10/13/2016  . Panic disorder 10/12/2016      Review of Systems See HPI, all other systems reviewed are negative.    Objective:   Physical Exam  Constitutional: She is oriented to person, place, and time. She appears well-developed and well-nourished.  HENT:  Head: Normocephalic and atraumatic.  Mouth/Throat: No oropharyngeal exudate, posterior oropharyngeal edema or posterior oropharyngeal erythema.  Petechiae on the palate  Cardiovascular: Normal rate, regular rhythm and normal heart sounds.  Pulmonary/Chest: Effort normal and breath sounds normal.  Abdominal: Soft. She exhibits no distension and no mass. There is no tenderness. There is no rebound and no guarding.  Lymphadenopathy:    She has no cervical adenopathy.  Neurological: She is alert and oriented to person, place, and time.  Skin: Skin is warm and dry.   Psychiatric: She has a normal mood and affect. Her behavior is normal. Thought content normal.  Vitals reviewed.     Assessment & Plan:  .Marland Kitchen.Marland Kitchen.Jenne CampusYesenia was seen today for std screen.  Diagnoses and all orders for this visit:  Screening for STD (sexually transmitted disease) -     C. trachomatis/N. gonorrhoeae RNA  Petechiae of palate -     CBC with Differential/Platelet -     POCT rapid strep A  Vaginal irritation -     WET PREP FOR TRICH, YEAST, CLUE  Unprotected sex   Vaginal irritation and screening for STD - Wet prep to rule out trichomonas, yeast, or BV infection. Gonorrhea and chlamydia probe to rule out gonorrhea or chlamydia infection even though patient denies symptoms of STI. Will call patient with results. -pt declined HIV, RPR testing.   Petechiae of palate Discussed causes could be viral, bacterial, fungal, due to trauma.  - Rapid strep performed to rule out strep pharyngitis due to petechiae of palate on physical exam and recent history of sore throat. Results were negative. Will obtain CBC to rule out other causes of petechia such as TTP. Will call patient with results and have her follow-up as needed. Likely this is viral or due to coughing when she had sore throat.  Call if spots not resolving or developing other symptoms such as fever, chills, ST, fatigue etc.

## 2017-07-27 LAB — WET PREP FOR TRICH, YEAST, CLUE
MICRO NUMBER: 81421590
SPECIMEN QUALITY 3963: ADEQUATE

## 2017-07-28 LAB — C. TRACHOMATIS/N. GONORRHOEAE RNA
C. trachomatis RNA, TMA: NOT DETECTED
N. GONORRHOEAE RNA, TMA: NOT DETECTED

## 2017-08-25 ENCOUNTER — Other Ambulatory Visit: Payer: Self-pay | Admitting: *Deleted

## 2017-08-25 MED ORDER — FLUCONAZOLE 150 MG PO TABS: 150.0000 mg | ORAL_TABLET | Freq: Once | ORAL | 0 refills | Status: AC

## 2017-08-25 NOTE — Progress Notes (Signed)
I just got a wet prep that has resulted to show yeast. I do not show she has been treated. If not can send diflucan 150mg  one tablet no refills to pharmacy.
